# Patient Record
Sex: Male | Born: 1970
Health system: Southern US, Community
[De-identification: ages and names within clinical notes are randomized; demographics above are authoritative.]

## PROBLEM LIST (undated history)

## (undated) DIAGNOSIS — N433 Hydrocele, unspecified: Secondary | ICD-10-CM

## (undated) HISTORY — PX: WISDOM TOOTH EXTRACTION: SHX21

---

## 2017-06-24 ENCOUNTER — Ambulatory Visit
Admission: EM | Admit: 2017-06-24 | Discharge: 2017-06-24 | Disposition: A | Discharge status: Home / Self Care | Payer: BLUE CROSS/BLUE SHIELD

## 2017-06-24 DIAGNOSIS — N433 Hydrocele, unspecified: Secondary | ICD-10-CM

## 2017-06-24 HISTORY — DX: Hydrocele, unspecified: N43.3

## 2017-06-24 NOTE — ED Triage Notes (Signed)
Pt reports he has a hydrocele which has been "up and down for past several years." Has been bothering him x past year and doesn't have a PCP. Wants to be sent to a urologist. Denies pain

## 2017-06-24 NOTE — ED Provider Notes (Signed)
[facility]    CSN: [id number] Arrival date & time: 06/24/17  0859     History   Chief Complaint Chief Complaint  Patient presents with  . Edema    HPI Casey Bishop is a 46 y.o. male.   46 yo male with a h/o a right hydrocele diagnosed by ultrasound about 2 years ago, now with progressively enlargement over the last month. Denies any pain, redness, fevers, chills, urinary symptoms.  States he would like to see a urologist.    The history is provided by the patient.    Past Medical History:  Diagnosis Date  . Hydrocele     There are no active problems to display for this patient.   History reviewed. No pertinent surgical history.     Home Medications    Prior to Admission medications   Not on File    Family History History reviewed. No pertinent family history.  Social History Social History  Substance Use Topics  . Smoking status: Never Smoker  . Smokeless tobacco: Never Used  . Alcohol use No     Allergies   Patient has no known allergies.   Review of Systems Review of Systems   Physical Exam Triage Vital Signs ED Triage Vitals  Enc Vitals Group     BP 06/24/17 0913 126/77     Pulse Rate 06/24/17 0913 (!) 57     Resp 06/24/17 0913 18     Temp 06/24/17 0913 98.3 F (36.8 C)     Temp Source 06/24/17 0913 Oral     SpO2 06/24/17 0913 100 %     Weight 06/24/17 0911 202 lb (91.6 kg)     Height 06/24/17 0911 5\' 11"  (1.803 m)     Head Circumference --      Peak Flow --      Pain Score --      Pain Loc --      Pain Edu? --      Excl. in GC? --    No data found.   Updated Vital Signs BP 126/77 (BP Location: Left Arm)   Pulse (!) 57   Temp 98.3 F (36.8 C) (Oral)   Resp 18   Ht 5\' 11"  (1.803 m)   Wt 202 lb (91.6 kg)   SpO2 100%   BMI 28.17 kg/m   Visual Acuity Right Eye Distance:   Left Eye Distance:   Bilateral Distance:    Right Eye Near:   Left Eye Near:    Bilateral Near:     Physical Exam    Constitutional: He appears well-developed and well-nourished. No distress.  Genitourinary: Penis normal.  Genitourinary Comments: Large right soft tissue, non-tender scrotal mass consistent with likely hydrocele  Skin: He is not diaphoretic.  Nursing note and vitals reviewed.    UC Treatments / Results  Labs (all labs ordered are listed, but only abnormal results are displayed) Labs Reviewed - No data to display  EKG  EKG Interpretation None       Radiology No results found.  Procedures Procedures (including critical care time)  Medications Ordered in UC Medications - No data to display   Initial Impression / Assessment and Plan / UC Course  I have reviewed the triage vital signs and the nursing notes.  Pertinent labs & imaging results that were available during my care of the patient were reviewed by me and considered in my medical decision making (see chart for details).  Final Clinical Impressions(s) / UC Diagnoses   Final diagnoses:  Hydrocele, right    New Prescriptions There are no discharge medications for this patient.  1. diagnosis reviewed with patient 2. Recommend patient follow up with urologist for further evaluation and management; information of local urology group given to patient to call and set up follow up 3. Discussed with patient if symptoms worsen in the meantime, to go to ED  4. Establish care with PCP 5. F/u prn   Payton Mccallumonty, Lyndsi Altic, MD 06/24/17 1126

## 2017-07-04 ENCOUNTER — Encounter: Payer: Self-pay | Admitting: Urology

## 2017-07-04 ENCOUNTER — Ambulatory Visit (INDEPENDENT_AMBULATORY_CARE_PROVIDER_SITE_OTHER): Payer: BLUE CROSS/BLUE SHIELD | Admitting: Urology

## 2017-07-04 VITALS — BP 124/72 | HR 64 | Ht 71.0 in | Wt 204.5 lb

## 2017-07-04 DIAGNOSIS — N433 Hydrocele, unspecified: Secondary | ICD-10-CM | POA: Diagnosis not present

## 2017-07-04 LAB — URINALYSIS, COMPLETE
Bilirubin, UA: NEGATIVE
Glucose, UA: NEGATIVE
KETONES UA: NEGATIVE
LEUKOCYTES UA: NEGATIVE
Nitrite, UA: NEGATIVE
Protein, UA: NEGATIVE
SPEC GRAV UA: 1.025 (ref 1.005–1.030)
Urobilinogen, Ur: 0.2 mg/dL (ref 0.2–1.0)
pH, UA: 5.5 (ref 5.0–7.5)

## 2017-07-04 LAB — MICROSCOPIC EXAMINATION
BACTERIA UA: NONE SEEN
EPITHELIAL CELLS (NON RENAL): NONE SEEN /HPF (ref 0–10)
RBC, UA: NONE SEEN /hpf (ref 0–?)

## 2017-07-04 NOTE — Progress Notes (Signed)
07/04/2017 1:38 PM   Casey Bishop 05/16/1971 [id number]  Referring provider: No referring provider defined for this encounter.  Chief Complaint  Patient presents with  . Hydrocele    HPI: Pt presents with c/o right scrotal swelling. He reports he was diagnosed with an hydrocele about 10 years ago and it seems to be getting  Bigger. Noticeable growth over past 2 years. Bothersome from the perspective that it's very heavy and it "get's in the way" when he sits. Not bothersome. No limitation of activity. He has no LUTS. He's watched the video of the repair and cyst aspiration. He reports he had an US in [place] [facility] 1-2 years ago.  Modifying factors: There are no other modifying factors  Associated signs and symptoms: There are no other associated signs and symptoms Aggravating and relieving factors: There are no other aggravating or relieving factors Severity: Moderate Duration: Persistent      PMH: Past Medical History:  Diagnosis Date  . Hydrocele     Surgical History: Past Surgical History:  Procedure Laterality Date  . WISDOM TOOTH EXTRACTION      Home Medications:  Allergies as of 07/04/2017   No Known Allergies     Medication List    as of 07/04/2017  1:38 PM   You have not been prescribed any medications.     Allergies: No Known Allergies  Family History: Family History  Problem Relation Age of Onset  . Prostate cancer Neg Hx   . Bladder Cancer Neg Hx   . Kidney cancer Neg Hx     Social History:  reports that he has never smoked. He has never used smokeless tobacco. He reports that he does not drink alcohol or use drugs.  ROS: UROLOGY Frequent Urination?: No Hard to postpone urination?: No Burning/pain with urination?: No Get up at night to urinate?: No Leakage of urine?: No Urine stream starts and stops?: No Trouble starting stream?: No Do you have to strain to urinate?: No Blood in urine?: No Urinary tract infection?:  No Sexually transmitted disease?: No Injury to kidneys or bladder?: No Painful intercourse?: No Weak stream?: No Erection problems?: No Penile pain?: No  Gastrointestinal Nausea?: No Vomiting?: No Indigestion/heartburn?: No Diarrhea?: No Constipation?: No  Constitutional Fever: No Night sweats?: No Weight loss?: No Fatigue?: No  Skin Skin rash/lesions?: No Itching?: No  Eyes Blurred vision?: No Double vision?: No  Ears/Nose/Throat Sore throat?: No Sinus problems?: No  Hematologic/Lymphatic Swollen glands?: No Easy bruising?: No  Cardiovascular Leg swelling?: No Chest pain?: No  Respiratory Cough?: No Shortness of breath?: No  Endocrine Excessive thirst?: No  Musculoskeletal Back pain?: No Joint pain?: No  Neurological Headaches?: No Dizziness?: No  Psychologic Depression?: No Anxiety?: No  Physical Exam: BP 124/72 (BP Location: Left Arm, Patient Position: Sitting, Cuff Size: Normal)   Pulse 64   Ht 5\' 11"  (1.803 m)   Wt 92.8 kg (204 lb 8 oz)   BMI 28.52 kg/m   Constitutional:  Alert and oriented, No acute distress. HEENT: [place] AT, moist mucus membranes.  Trachea midline, no masses. Cardiovascular: No clubbing, cyanosis, or edema. Respiratory: Normal respiratory effort, no increased work of breathing. GI: Abdomen is soft, nontender, nondistended, no abdominal masses GU: No CVA tenderness.  Penis normal Scrotum normal; left testicle palpable normal and right testicle non-palp. Large cystic right hydrocele.  DRE - 30 g , smooth and no nodules  Skin: No rashes, bruises or suspicious lesions. Lymph: No cervical or inguinal adenopathy.  Neurologic: Grossly intact, no focal deficits, moving all 4 extremities. Psychiatric: Normal mood and affect.  Laboratory Data: No results found for: WBC, HGB, HCT, MCV, PLT  No results found for: CREATININE  No results found for: PSA  No results found for: TESTOSTERONE  No results found for:  HGBA1C  Urinalysis No results found for: COLORURINE, APPEARANCEUR, LABSPEC, PHURINE, GLUCOSEU, HGBUR, BILIRUBINUR, KETONESUR, PROTEINUR, UROBILINOGEN, NITRITE, LEUKOCYTESUR    Assessment & Plan:    1. Hydrocele, unspecified hydrocele type -- discussed with patient nature risk, benefit of surveillance, aspiration, and right hydrocelectomy. He is bothered by the heavy size. Will get [place] from his urologist in [place] or order a new one if no recent. I discussed one of my colleagues would be doing the procedure. We discussed risks such as swelling, hematoma, recurrence, bleeding and pain among others.    - Urinalysis, Complete   No Follow-up on file.  Jerilee Field, MD  [facility] [address] [place], [place] [address] [phone]

## 2017-07-06 ENCOUNTER — Other Ambulatory Visit: Payer: Self-pay | Admitting: Radiology

## 2017-07-06 ENCOUNTER — Telehealth: Payer: Self-pay | Admitting: Radiology

## 2017-07-06 ENCOUNTER — Other Ambulatory Visit: Payer: Self-pay

## 2017-07-06 DIAGNOSIS — N433 Hydrocele, unspecified: Secondary | ICD-10-CM

## 2017-07-06 NOTE — Telephone Encounter (Signed)
Pt scheduled for right hydrocelectomy with Dr Sherryl BartersBudzyn on 07/21/17 per pt request. Made pt aware of pre-admit testing phone interview on 07/11/17 between 1-5pm & to call day prior to surgery for arrival time to [facility]. Questions answered. Pt voices understanding.

## 2017-07-11 ENCOUNTER — Encounter: Admission: RE | Admit: 2017-07-11 | Payer: BLUE CROSS/BLUE SHIELD | Source: Ambulatory Visit

## 2017-07-12 ENCOUNTER — Other Ambulatory Visit: Payer: Self-pay | Admitting: Radiology

## 2017-07-12 ENCOUNTER — Telehealth: Payer: Self-pay | Admitting: Radiology

## 2017-07-12 DIAGNOSIS — N433 Hydrocele, unspecified: Secondary | ICD-10-CM

## 2017-07-12 NOTE — Telephone Encounter (Signed)
Pt states he would like to pursue aspiration of his right hydrocele instead of hydrocelectomy d/t cost. Advised pt that per Dr Mena Goes pt will need a current scrotal US prior to procedure in the office & hospital will contact pt to schedule the [place]. Appt made for aspiration in the office with Dr Mena Goes. Questions answered. Pt voices understanding.

## 2017-07-14 ENCOUNTER — Other Ambulatory Visit: Payer: BLUE CROSS/BLUE SHIELD

## 2017-07-18 ENCOUNTER — Ambulatory Visit
Admission: RE | Admit: 2017-07-18 | Discharge: 2017-07-18 | Disposition: A | Discharge status: Home / Self Care | Payer: BLUE CROSS/BLUE SHIELD | Source: Ambulatory Visit | Attending: Urology | Admitting: Urology

## 2017-07-18 ENCOUNTER — Ambulatory Visit: Payer: BLUE CROSS/BLUE SHIELD

## 2017-07-18 DIAGNOSIS — N433 Hydrocele, unspecified: Secondary | ICD-10-CM | POA: Insufficient documentation

## 2017-07-18 DIAGNOSIS — N442 Benign cyst of testis: Secondary | ICD-10-CM | POA: Diagnosis not present

## 2017-07-21 ENCOUNTER — Ambulatory Visit: Admission: RE | Admit: 2017-07-21 | Payer: BLUE CROSS/BLUE SHIELD | Source: Ambulatory Visit | Admitting: Urology

## 2017-07-21 ENCOUNTER — Encounter: Admission: RE | Payer: Self-pay | Source: Ambulatory Visit

## 2017-07-21 SURGERY — HYDROCELECTOMY
Anesthesia: Choice | Laterality: Right

## 2017-07-25 ENCOUNTER — Telehealth: Payer: Self-pay

## 2017-07-25 NOTE — Telephone Encounter (Signed)
Jerilee FieldEskridge, Matthew, MD  Skeet LatchWatkins, Roslin Norwood C, LPN        Notify patient his scrotal US showed no testicle mass and a large right hydrocele. F/u as planned.   Spoke with pt in reference to US results. Pt voiced understanding.

## 2017-08-01 ENCOUNTER — Encounter: Payer: Self-pay | Admitting: Urology

## 2017-08-01 ENCOUNTER — Ambulatory Visit (INDEPENDENT_AMBULATORY_CARE_PROVIDER_SITE_OTHER): Payer: BLUE CROSS/BLUE SHIELD | Admitting: Urology

## 2017-08-01 VITALS — BP 114/69 | HR 62 | Ht 71.0 in | Wt 197.7 lb

## 2017-08-01 DIAGNOSIS — N433 Hydrocele, unspecified: Secondary | ICD-10-CM

## 2017-08-01 NOTE — Progress Notes (Signed)
   08/01/17  CC:  Chief Complaint  Patient presents with  . Hydrocele    HPI: Patient returns for right hydrocele aspiration. He underwent a scrotal ultrasound and I reviewed the images. This showed normal testicles. There was a large right hydrocele. There was at least a 5 cm skin to testicle distance at the closest point. I discussed with the patient again the nature risks benefits and alternatives to aspiration. We discussed risk of aspiration include bleeding, infection and rapid recurrence among others. All questions answered. He elects to proceed.   Blood pressure 114/69, pulse 62, height 5\' 11"  (1.803 m), weight 89.7 kg (197 lb 11.2 oz). NED. A&Ox3.   No respiratory distress   Abd soft, NT, ND Normal phallus with bilateral descended testicles, large right hydrocele (5 cm x 10 cm)  Hydrocele aspiration Procedure Note  Patient identification was confirmed, informed consent was obtained, and patient was prepped using Betadine solution over the right hemiscrotum. Using ultrasound guidance a 20-gauge needle was advanced into the hydrocele fluid. 425 cc of straw-colored fluid was aspirated. The needle was withdrawn and pressure held for a few minutes. He tolerated the procedure well. There were no complications. The left testicle was palpable at the beginning of the procedure and was palpably normal. The right testicle was palpable at the end of the procedure and was palpably normal.  Assessment/ Plan: Right hydrocele aspiration-he can follow-up as needed.

## 2017-10-03 ENCOUNTER — Encounter: Payer: Self-pay | Admitting: Internal Medicine

## 2017-10-03 ENCOUNTER — Ambulatory Visit (INDEPENDENT_AMBULATORY_CARE_PROVIDER_SITE_OTHER): Payer: BLUE CROSS/BLUE SHIELD | Admitting: Internal Medicine

## 2017-10-03 VITALS — BP 122/74 | HR 76 | Ht 71.0 in | Wt 194.0 lb

## 2017-10-03 DIAGNOSIS — Z87438 Personal history of other diseases of male genital organs: Secondary | ICD-10-CM

## 2017-10-03 DIAGNOSIS — Z7689 Persons encountering health services in other specified circumstances: Secondary | ICD-10-CM | POA: Diagnosis not present

## 2017-10-03 NOTE — Progress Notes (Signed)
    Date:  10/03/2017   Name:  Casey Bishop   DOB:  07/16/1971   MRN:  [id number]   Chief Complaint: Establish Care (From [place]. Been here for about a year now- just needs PCP. ) He denies any chronic health issues.  Recently had hydrocele drained and doing fine.  He did have a prostate exam at that time as was normal. He has family hx of intestinal cancer in father (probably pancreatic) but no other family hx cancer. He needs to establish PCP. May want to get a CPX next year.   Review of Systems  Constitutional: Negative for chills, fatigue and fever.  Eyes: Negative for visual disturbance.  Respiratory: Negative for cough, chest tightness, shortness of breath and wheezing.   Cardiovascular: Negative for chest pain, palpitations and leg swelling.  Gastrointestinal: Negative for abdominal pain.  Musculoskeletal: Negative for arthralgias and gait problem.  Skin: Negative for rash.  Allergic/Immunologic: Negative for environmental allergies.  Neurological: Negative for dizziness and headaches.  Hematological: Negative for adenopathy.  Psychiatric/Behavioral: Negative for dysphoric mood and sleep disturbance.    There are no active problems to display for this patient.   Prior to Admission medications   Medication Sig Start Date End Date Taking? Authorizing Provider  Multiple Vitamin (MULTIVITAMIN WITH MINERALS) TABS tablet Take 1 tablet by mouth 2 (two) times a week. One-A-Day   Yes [provider]    No Known Allergies  Past Surgical History:  Procedure Laterality Date  . WISDOM TOOTH EXTRACTION      Social History   Tobacco Use  . Smoking status: Never Smoker  . Smokeless tobacco: Never Used  Substance Use Topics  . Alcohol use: No  . Drug use: No     Medication list has been reviewed and updated.  PHQ 2/9 Scores 10/03/2017  PHQ - 2 Score 0    Physical Exam  Constitutional: He is oriented to person, place, and time. He appears well-developed. No  distress.  HENT:  Head: Normocephalic and atraumatic.  Neck: Normal range of motion. Neck supple. No thyromegaly present.  Cardiovascular: Normal rate, regular rhythm and normal heart sounds.  Pulmonary/Chest: Effort normal and breath sounds normal. No respiratory distress. He has no wheezes.  Abdominal: Soft. Bowel sounds are normal. He exhibits no distension. There is no tenderness.  Musculoskeletal: Normal range of motion. He exhibits no edema.  Neurological: He is alert and oriented to person, place, and time.  Skin: Skin is warm and dry. No rash noted.  Psychiatric: He has a normal mood and affect. His behavior is normal. Thought content normal.  Nursing note and vitals reviewed.   BP 122/74   Pulse 76   Ht 5\' 11"  (1.803 m)   Wt 194 lb (88 kg)   SpO2 98%   BMI 27.06 kg/m   Assessment and Plan: 1. Encounter to establish care with new doctor Return for CPX next year and as needed  2. History of hydrocele resolved   No orders of the defined types were placed in this encounter.   Partially dictated using Animal nutritionistDragon software. Any errors are unintentional.  Bari EdwardLaura Nial Hawe, MD [facility]  10/03/2017

## 2017-12-05 ENCOUNTER — Encounter: Payer: Self-pay | Admitting: Internal Medicine

## 2017-12-05 ENCOUNTER — Ambulatory Visit (INDEPENDENT_AMBULATORY_CARE_PROVIDER_SITE_OTHER): Payer: BLUE CROSS/BLUE SHIELD | Admitting: Internal Medicine

## 2017-12-05 VITALS — BP 120/76 | HR 66 | Ht 71.0 in | Wt 183.0 lb

## 2017-12-05 DIAGNOSIS — Z125 Encounter for screening for malignant neoplasm of prostate: Secondary | ICD-10-CM

## 2017-12-05 DIAGNOSIS — Z0000 Encounter for general adult medical examination without abnormal findings: Secondary | ICD-10-CM

## 2017-12-05 DIAGNOSIS — Z1211 Encounter for screening for malignant neoplasm of colon: Secondary | ICD-10-CM

## 2017-12-05 LAB — POCT URINALYSIS DIPSTICK
Bilirubin, UA: NEGATIVE
GLUCOSE UA: NEGATIVE
Ketones, UA: NEGATIVE
LEUKOCYTES UA: NEGATIVE
Nitrite, UA: NEGATIVE
PH UA: 6 (ref 5.0–8.0)
RBC UA: NEGATIVE
Spec Grav, UA: 1.02 (ref 1.010–1.025)
UROBILINOGEN UA: 0.2 U/dL

## 2017-12-05 NOTE — Progress Notes (Signed)
Date:  12/05/2017   Name:  Casey Bishop   DOB:  06/30/1971   MRN:  [id number]   Chief Complaint: Annual Exam Casey Bishop is a 47 y.o. male who presents today for his Complete Annual Exam. He feels well. He reports exercising regularly. He reports he is sleeping well.  He denies family hx of colon or prostate cancer.  His father died of intestinal cancer (not colon). He is exercising regularly - has lost about 10 lbs with exercise and diet over the past year. He needs labs for work, including cotinine.  He does not smoke and consumes no alcohol.   Review of Systems  Constitutional: Negative for appetite change, chills, diaphoresis, fatigue and unexpected weight change.  HENT: Negative for hearing loss, tinnitus, trouble swallowing and voice change.   Eyes: Negative for visual disturbance.  Respiratory: Negative for choking, shortness of breath and wheezing.   Cardiovascular: Negative for chest pain, palpitations and leg swelling.  Gastrointestinal: Negative for abdominal pain, blood in stool, constipation and diarrhea.  Genitourinary: Negative for difficulty urinating, dysuria and frequency.  Musculoskeletal: Negative for arthralgias, back pain and myalgias.  Skin: Negative for color change and rash.  Neurological: Negative for dizziness, syncope and headaches.  Hematological: Negative for adenopathy.  Psychiatric/Behavioral: Negative for dysphoric mood and sleep disturbance.    Patient Active Problem List   Diagnosis Date Noted  . History of hydrocele 10/03/2017    Prior to Admission medications   Medication Sig Start Date End Date Taking? Authorizing Provider  Multiple Vitamin (MULTIVITAMIN WITH MINERALS) TABS tablet Take 1 tablet by mouth 2 (two) times a week. One-A-Day   Yes [provider]    No Known Allergies  Past Surgical History:  Procedure Laterality Date  . WISDOM TOOTH EXTRACTION      Social History   Tobacco Use  . Smoking status: Never Smoker   . Smokeless tobacco: Never Used  Substance Use Topics  . Alcohol use: No  . Drug use: No     Medication list has been reviewed and updated.  PHQ 2/9 Scores 10/03/2017  PHQ - 2 Score 0    Physical Exam  Constitutional: He is oriented to person, place, and time. He appears well-developed and well-nourished.  HENT:  Head: Normocephalic.  Right Ear: Tympanic membrane, external ear and ear canal normal.  Left Ear: Tympanic membrane, external ear and ear canal normal.  Nose: Nose normal.  Mouth/Throat: Uvula is midline and oropharynx is clear and moist.  Eyes: Conjunctivae and EOM are normal. Pupils are equal, round, and reactive to light.  Neck: Normal range of motion. Neck supple. Carotid bruit is not present. No thyromegaly present.  Cardiovascular: Normal rate, regular rhythm, normal heart sounds and intact distal pulses.  Pulmonary/Chest: Effort normal and breath sounds normal. He has no wheezes. Right breast exhibits no mass. Left breast exhibits no mass.  Abdominal: Soft. Normal appearance and bowel sounds are normal. There is no hepatosplenomegaly. There is no tenderness.  Musculoskeletal: Normal range of motion. He exhibits no edema or tenderness.  Lymphadenopathy:    He has no cervical adenopathy.  Neurological: He is alert and oriented to person, place, and time. He has normal reflexes.  Skin: Skin is warm, dry and intact.  Psychiatric: He has a normal mood and affect. His speech is normal and behavior is normal. Judgment and thought content normal.  Nursing note and vitals reviewed.   BP 120/76 (BP Location: Right Arm, Patient Position: Sitting, Cuff Size: Normal)  Pulse 66   Ht 5\' 11"  (1.803 m)   Wt 183 lb (83 kg)   SpO2 100%   BMI 25.52 kg/m   Assessment and Plan: 1. Annual physical exam Normal exam - CBC with Differential/Platelet - Comprehensive metabolic panel - Lipid panel - POCT urinalysis dipstick - Nicotine/cotinine metabolites  2. Colon cancer  screening Should start screening at age 70 - Ambulatory referral to Gastroenterology  3. Prostate cancer screening DRE deferred - done recently by Urology - PSA   No orders of the defined types were placed in this encounter.   Partially dictated using Animal nutritionist. Any errors are unintentional.  Bari Edward, MD [facility]  12/05/2017

## 2017-12-11 ENCOUNTER — Encounter: Payer: Self-pay | Admitting: Internal Medicine

## 2017-12-11 DIAGNOSIS — E785 Hyperlipidemia, unspecified: Secondary | ICD-10-CM | POA: Insufficient documentation

## 2017-12-11 LAB — CBC WITH DIFFERENTIAL/PLATELET
Basophils Absolute: 0.1 10*3/uL (ref 0.0–0.2)
Basos: 1 %
EOS (ABSOLUTE): 0.3 10*3/uL (ref 0.0–0.4)
EOS: 5 %
HEMATOCRIT: 41.9 % (ref 37.5–51.0)
HEMOGLOBIN: 13.4 g/dL (ref 13.0–17.7)
IMMATURE GRANULOCYTES: 0 %
Immature Grans (Abs): 0 10*3/uL (ref 0.0–0.1)
Lymphocytes Absolute: 1.9 10*3/uL (ref 0.7–3.1)
Lymphs: 31 %
MCH: 27.6 pg (ref 26.6–33.0)
MCHC: 32 g/dL (ref 31.5–35.7)
MCV: 86 fL (ref 79–97)
MONOCYTES: 10 %
Monocytes Absolute: 0.6 10*3/uL (ref 0.1–0.9)
NEUTROS PCT: 53 %
Neutrophils Absolute: 3.3 10*3/uL (ref 1.4–7.0)
Platelets: 253 10*3/uL (ref 150–379)
RBC: 4.86 x10E6/uL (ref 4.14–5.80)
RDW: 14 % (ref 12.3–15.4)
WBC: 6.2 10*3/uL (ref 3.4–10.8)

## 2017-12-11 LAB — COMPREHENSIVE METABOLIC PANEL
ALT: 11 IU/L (ref 0–44)
AST: 18 IU/L (ref 0–40)
Albumin/Globulin Ratio: 1.6 (ref 1.2–2.2)
Albumin: 4.6 g/dL (ref 3.5–5.5)
Alkaline Phosphatase: 66 IU/L (ref 39–117)
BUN/Creatinine Ratio: 10 (ref 9–20)
BUN: 10 mg/dL (ref 6–24)
Bilirubin Total: 0.6 mg/dL (ref 0.0–1.2)
CALCIUM: 9.1 mg/dL (ref 8.7–10.2)
CO2: 24 mmol/L (ref 20–29)
CREATININE: 0.99 mg/dL (ref 0.76–1.27)
Chloride: 102 mmol/L (ref 96–106)
GFR calc Af Amer: 106 mL/min/{1.73_m2} (ref >59)
GFR, EST NON AFRICAN AMERICAN: 92 mL/min/{1.73_m2} (ref >59)
GLOBULIN, TOTAL: 2.9 g/dL (ref 1.5–4.5)
Glucose: 78 mg/dL (ref 65–99)
Potassium: 4.1 mmol/L (ref 3.5–5.2)
SODIUM: 143 mmol/L (ref 134–144)
Total Protein: 7.5 g/dL (ref 6.0–8.5)

## 2017-12-11 LAB — LIPID PANEL
CHOL/HDL RATIO: 3.4 ratio (ref 0.0–5.0)
Cholesterol, Total: 162 mg/dL (ref 100–199)
HDL: 48 mg/dL (ref >39)
LDL CALC: 104 mg/dL — AB (ref 0–99)
TRIGLYCERIDES: 50 mg/dL (ref 0–149)
VLDL CHOLESTEROL CAL: 10 mg/dL (ref 5–40)

## 2017-12-11 LAB — NICOTINE/COTININE METABOLITES
COTININE: NOT DETECTED ng/mL
Nicotine: NOT DETECTED ng/mL

## 2017-12-11 LAB — PSA: Prostate Specific Ag, Serum: 0.8 ng/mL (ref 0.0–4.0)

## 2019-01-03 IMAGING — US US ART/VEN ABD/PELV/SCROTUM DOPPLER LTD
1 series · 13 of 25 positions shown · non-contrast
Comparison: None.

CLINICAL DATA: Large symptomatic Right hydrocele

EXAM:
SCROTAL ULTRASOUND
DOPPLER ULTRASOUND OF THE TESTICLES
TECHNIQUE: Complete ultrasound examination of the testicles, epididymis, and
other scrotal structures was performed. Color and spectral Doppler
ultrasound were also utilized to evaluate blood flow to the
testicles.

[Series 1: us art/ven abd/pelv/scrotum doppler ltd · 0.08mm/px · 62 acquisitions, 13 frames shown]
[im 1/62]
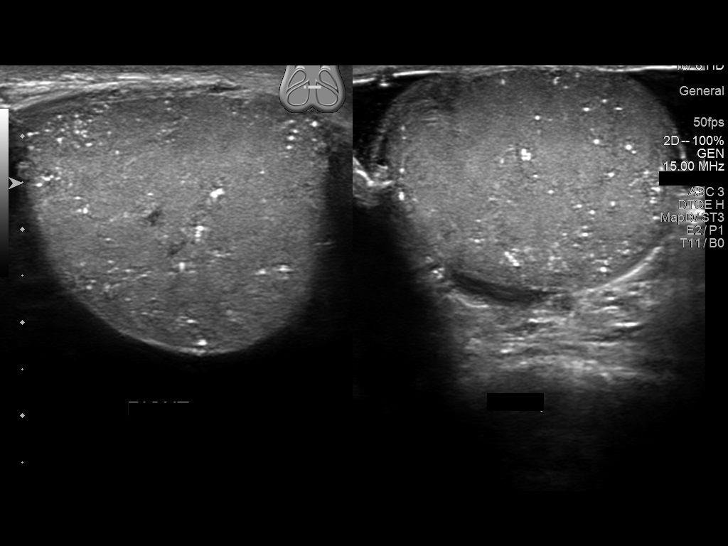
[im 6/62]
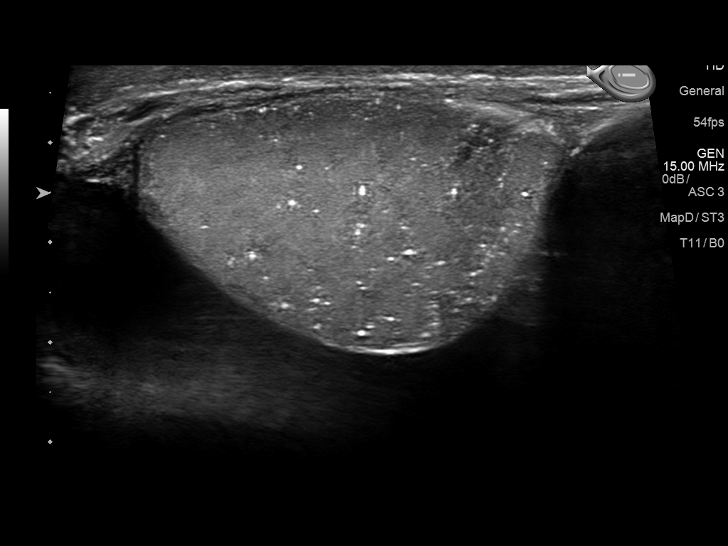
[im 11/62]
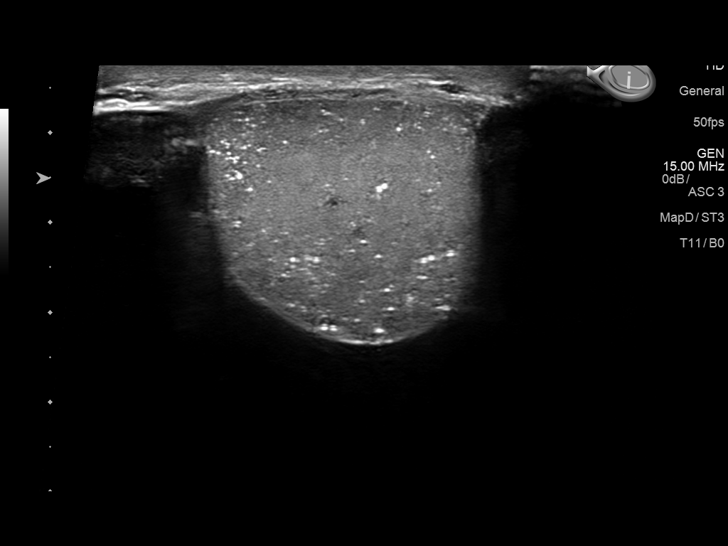
[im 16/62]
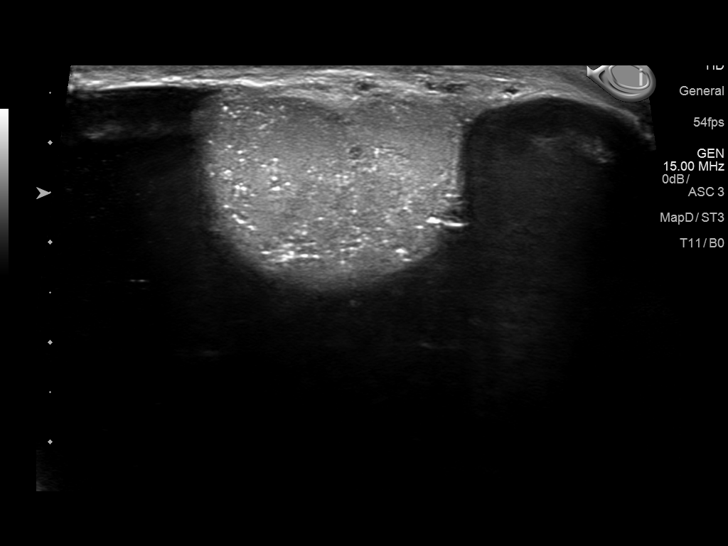
[im 21/62]
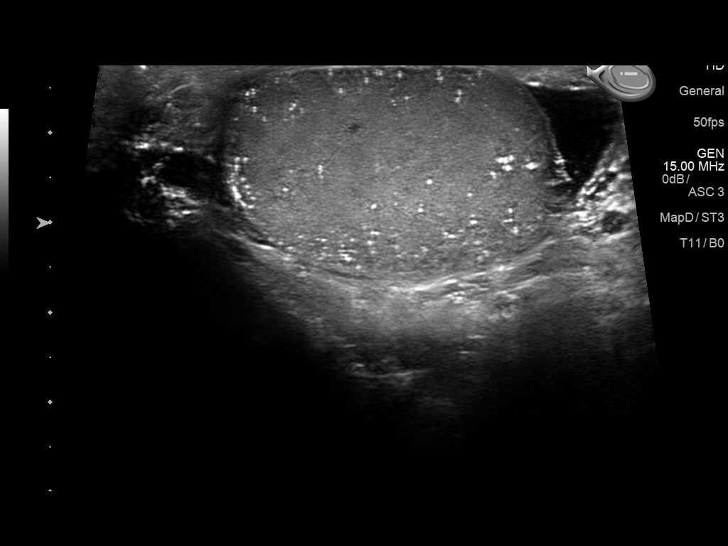
[im 26/62]
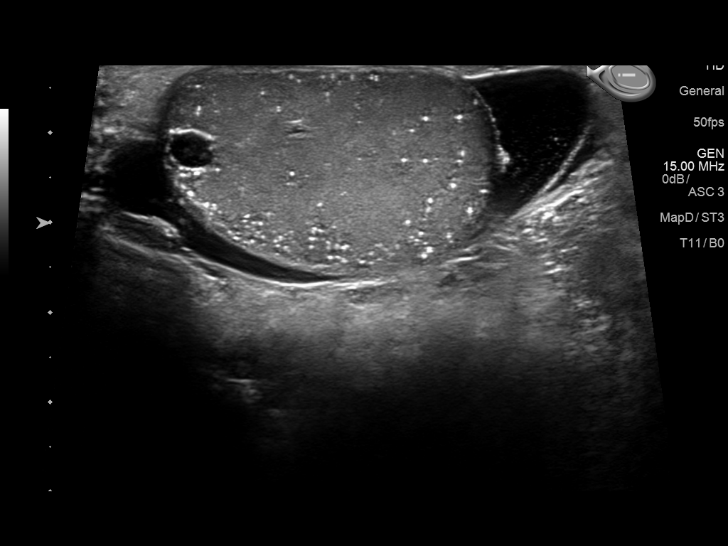
[im 31/62]
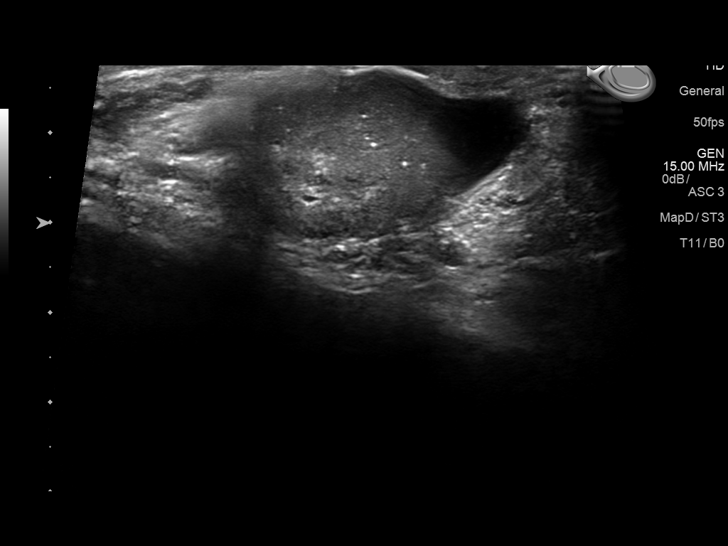
[im 36/62]
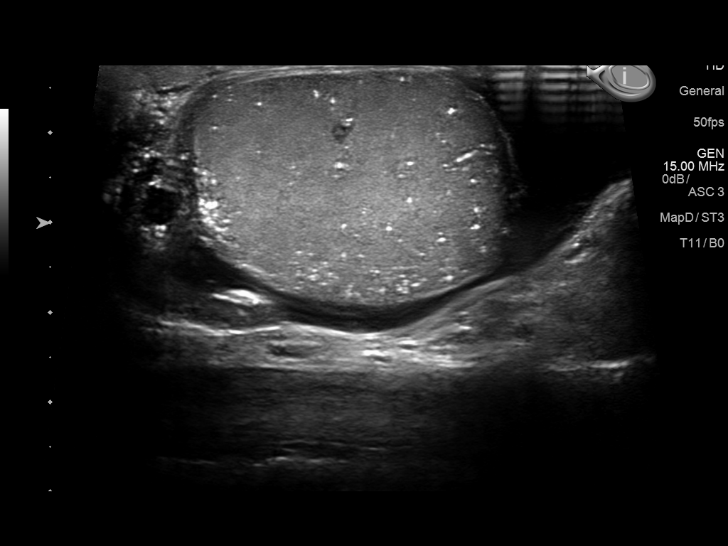
[im 41/62]
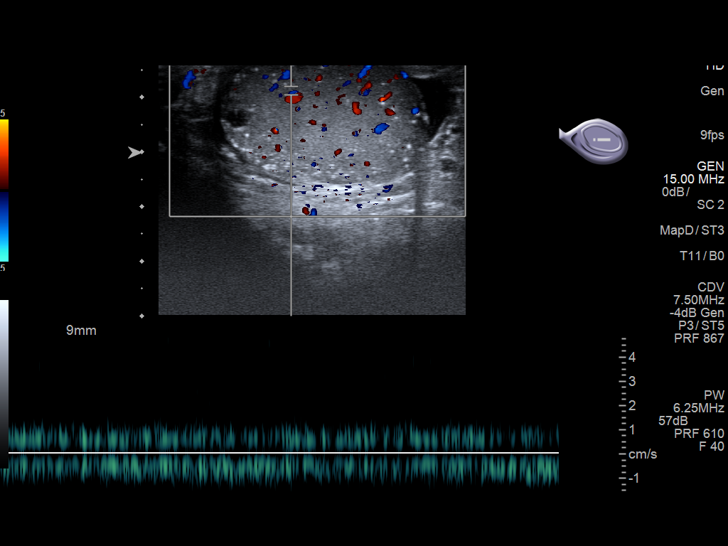
[im 46/62]
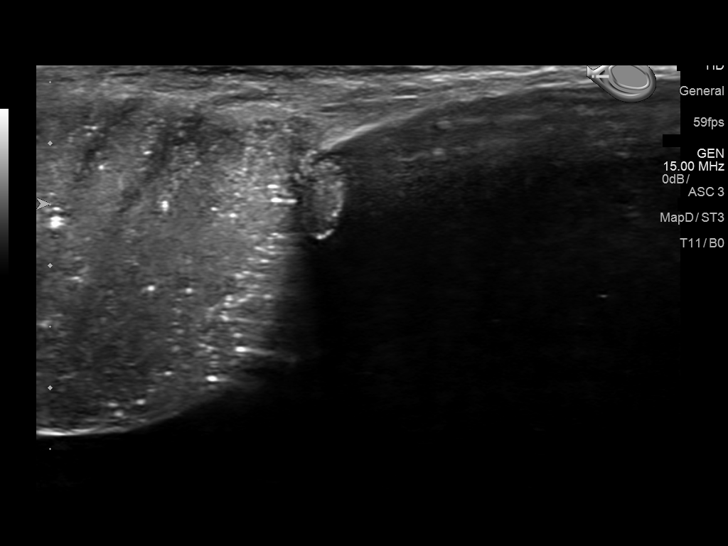
[im 51/62]
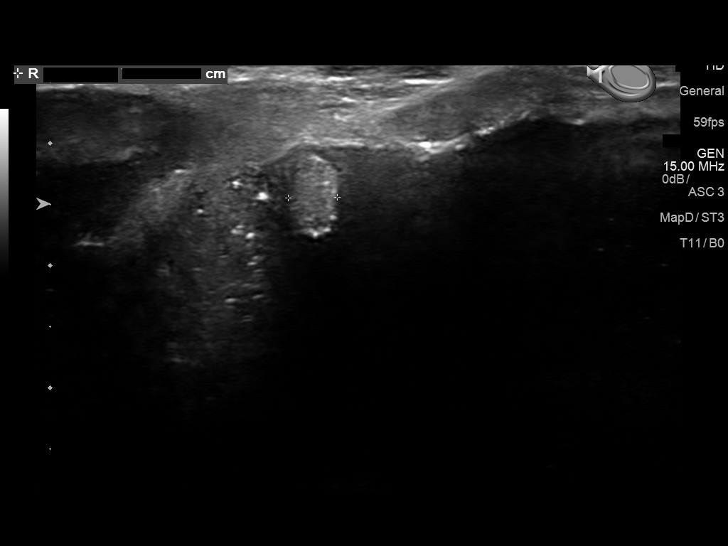
[im 56/62]
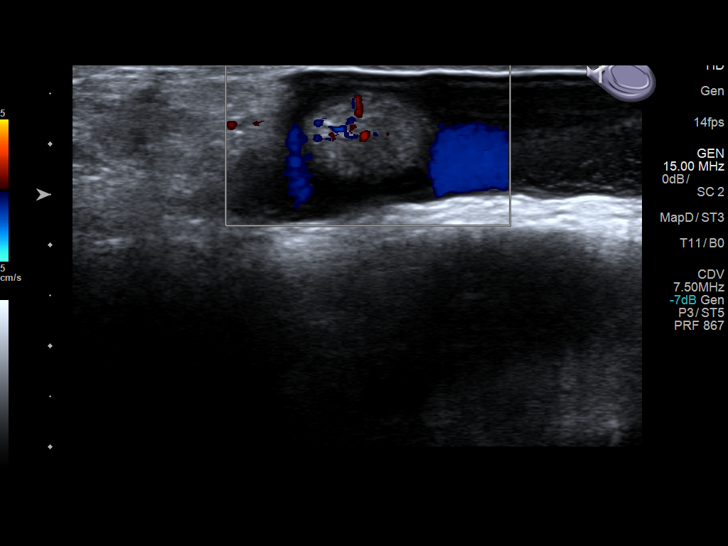
[im 62/62]
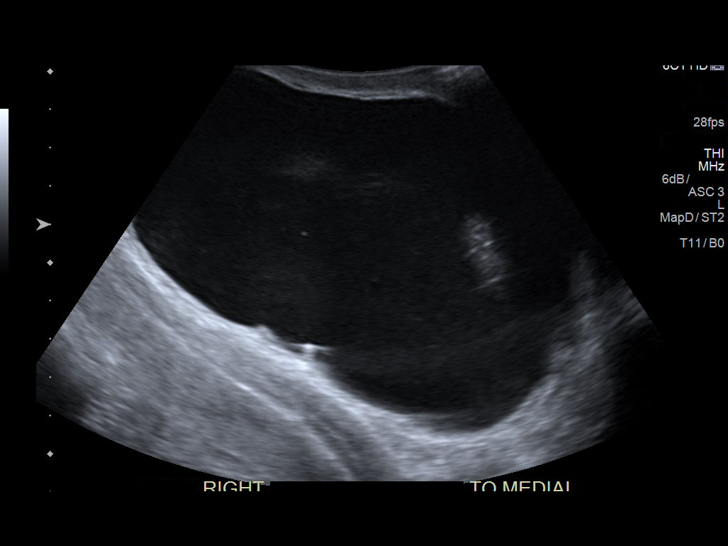

[13 of 25 positions shown; findings below may reference images not displayed]

FINDINGS: Right testicle

Measurements: 4.4 x 2.6 x 3.1 cm. Microlithiasis evident. No focal
testicular abnormality. Normal vascularity. Negative for torsion.

Left testicle

Measurements: 3.7 x 2.4 x 3.5 cm. Microlithiasis evident. Minimally
complex anechoic left testicular cyst with peripheral calcification
measures 6 x 5 x 5 mm. Normal vascularity. Negative for torsion.

Right epididymis: Grossly normal but appears compressed by the
hydrocele.

Left epididymis:  Normal in size and appearance.

Hydrocele: Very large right hydrocele. Small left hydrocele also
evident.

Varicocele:  None visualized.

Pulsed Doppler interrogation of both testes demonstrates normal low
resistance arterial and venous waveforms bilaterally.
IMPRESSION: Large right hydrocele.

Small left hydrocele

Bilateral testicular microlithiasis

Current literature suggests that testicular microlithiasis is not a
significant independent risk factor for development of testicular
carcinoma, and that follow up imaging is not warranted in the
absence of other risk factors. Monthly testicular self-examination
and annual physical exams are considered appropriate surveillance.
If patient has other risk factors for testicular carcinoma, then
referral to Urology should be considered. (Reference: Ronlor, et
al.: A 5-Year Follow up Study of Asymptomatic Men with Testicular
Microlithiasis. J Urol 1668; [id number].)

Negative for torsion.  Normal scrotal Doppler.

6 mm left testicular cyst superiorly.

## 2019-07-03 ENCOUNTER — Encounter: Payer: Self-pay | Admitting: Urology

## 2019-07-03 ENCOUNTER — Other Ambulatory Visit: Payer: Self-pay | Admitting: Radiology

## 2019-07-03 ENCOUNTER — Telehealth: Payer: Self-pay | Admitting: Radiology

## 2019-07-03 ENCOUNTER — Ambulatory Visit (INDEPENDENT_AMBULATORY_CARE_PROVIDER_SITE_OTHER): Payer: 59 | Admitting: Urology

## 2019-07-03 ENCOUNTER — Other Ambulatory Visit: Payer: Self-pay

## 2019-07-03 VITALS — BP 137/81 | HR 60 | Ht 71.0 in | Wt 187.0 lb

## 2019-07-03 DIAGNOSIS — N433 Hydrocele, unspecified: Secondary | ICD-10-CM

## 2019-07-03 NOTE — Patient Instructions (Signed)
Hydrocele, Adult °A hydrocele is a collection of fluid in the loose pouch of skin that holds the testicles (scrotum). This may happen because: °· The amount of fluid produced in the scrotum is not absorbed by the rest of the body. °· Fluid from the abdomen fills the scrotum. Normally, the testicles develop in the abdomen then move (drop) into to the scrotum before birth. The tube that the testicles travel through usually closes after the testicles drop. If the tube does not close, fluid from the abdomen can fill the scrotum. This is less common in adults. °What are the causes? °The cause of a hydrocele in adults is usually not known. However, it may be caused by: °· An injury to the scrotum. °· An infection (epididymitis). °· Decreased blood flow to the scrotum. °· Twisting of a testicle (testicular torsion). °· A birth defect. °· A tumor or cancer of the testicle. °What are the signs or symptoms? °A hydrocele feels like a water-filled balloon. It may also feel heavy. Other symptoms include: °· Swelling of the scrotum. The swelling may decrease when you lie down. You may also notice more swelling at night than in the morning. °· Swelling of the groin. °· Mild discomfort in the scrotum. °· Pain. This can develop if the hydrocele was caused by infection or twisting. The larger the hydrocele, the more likely you are to have pain. °How is this diagnosed? °This condition may be diagnosed based on: °· Physical exam. °· Medical history. °You may also have other tests, including: °· Imaging tests, such as ultrasound. °· Blood or urine tests. °How is this treated? °Most hydroceles go away on their own. If you have no discomfort or pain, your health care provider may suggest close monitoring of your condition (called watch and wait or watchful waiting) until the condition goes away or symptoms develop. If treatment is needed, it may include: °· Treating an underlying condition. This may include using an antibiotic medicine to  treat an infection. °· Surgery to stop fluid from collecting in the scrotum. °· Surgery to drain the fluid. Options include: °? Needle aspiration. A needle is used to drain fluid. However, the fluid buildup will come back quickly. °? Hydrocelectomy. For this procedure, an incision is made in the scrotum to remove the fluid sac. °Follow these instructions at home: °· Watch the hydrocele for any changes. °· Take over-the-counter and prescription medicines only as told by your health care provider. °· If you were prescribed an antibiotic medicine, use it as told by your health care provider. Do not stop taking the antibiotic even if you start to feel better. °· Keep all follow-up visits as told by your health care provider. This is important. °Contact a health care provider if: °· You notice any changes in the hydrocele. °· The swelling in your scrotum or groin gets worse. °· The hydrocele becomes red, firm, painful, or tender to the touch. °· You have a fever. °Get help right away if you: °· Develop a lot of pain, or your pain becomes worse. °Summary °· A hydrocele is a collection of fluid in the loose pouch of skin that holds the testicles (scrotum). °· Hydroceles can cause swelling, discomfort, and sometimes pain. °· In adults, the cause of a hydrocele usually is not known. However, it is sometimes caused by an infection or a rotation and twisting of the scrotum. °· Treatment is usually not needed. Hydroceles often go away on their own. If a hydrocele causes pain, treatment   may be given to ease the pain. °This information is not intended to replace advice given to you by your health care provider. Make sure you discuss any questions you have with your health care provider. °Document Released: 04/27/2010 Document Revised: 11/18/2017 Document Reviewed: 11/18/2017 °Elsevier Patient Education © 2020 [employer] ° °

## 2019-07-03 NOTE — Progress Notes (Signed)
07/03/2019 9:07 AM   Casey Bishop 1971/07/14 [id number]  Referring provider: Glean Hess, MD [address] [place],  [place] [address]  Chief Complaint  Patient presents with  . Hydrocele    HPI:  Casey Bishop returns with a right hydrocele.  Hydrocele noted in about 2008 on the left side.  Ultrasound July 18, 2017 confirmed a left hydrocele.  He ultimately underwent aspiration in the office on 08/01/2017 yielding about 450 cc of straw-colored fluid.  He returns and has recurrence of bothersome right hydrocele. It remained small for only about "a month" in 2018 and he is no ready to entertain surgical repair. The hydrocele seems larger and more bothersome. It gets in the way of daily activities. No voiding complaints. No gross hematuria or dysuria.   PMH: Past Medical History:  Diagnosis Date  . Hydrocele     Surgical History: Past Surgical History:  Procedure Laterality Date  . WISDOM TOOTH EXTRACTION      Home Medications:  Allergies as of 07/03/2019   No Known Allergies     Medication List       Accurate as of July 03, 2019  9:07 AM. If you have any questions, ask your nurse or doctor.        multivitamin with minerals Tabs tablet Take 1 tablet by mouth 2 (two) times a week. One-A-Day       Allergies: No Known Allergies  Family History: Family History  Problem Relation Age of Onset  . Diabetes Mother   . Stomach cancer Father        intestinal   . Heart failure Paternal Grandmother     Social History:  reports that he has never smoked. He has never used smokeless tobacco. He reports that he does not drink alcohol or use drugs.  ROS: UROLOGY Frequent Urination?: No Hard to postpone urination?: No Burning/pain with urination?: No Get up at night to urinate?: No Leakage of urine?: No Urine stream starts and stops?: No Trouble starting stream?: No Do you have to strain to urinate?: No Blood in urine?: No Urinary tract infection?: No  Sexually transmitted disease?: No Injury to kidneys or bladder?: No Painful intercourse?: No Weak stream?: No Erection problems?: No Penile pain?: No  Gastrointestinal Nausea?: No Vomiting?: No Indigestion/heartburn?: No Diarrhea?: No Constipation?: No  Constitutional Fever: No Night sweats?: No Weight loss?: No Fatigue?: No  Skin Skin rash/lesions?: No Itching?: No  Eyes Blurred vision?: No Double vision?: No  Ears/Nose/Throat Sore throat?: No Sinus problems?: No  Hematologic/Lymphatic Swollen glands?: No Easy bruising?: No  Cardiovascular Leg swelling?: No Chest pain?: No  Respiratory Cough?: No Shortness of breath?: No  Endocrine Excessive thirst?: No  Musculoskeletal Back pain?: No Joint pain?: No  Neurological Headaches?: No Dizziness?: No  Psychologic Depression?: No Anxiety?: No  Physical Exam: BP 137/81 (BP Location: Left Arm, Patient Position: Sitting, Cuff Size: Normal)   Pulse 60   Ht 5\' 11"  (1.803 m)   Wt 84.8 kg   BMI 26.08 kg/m   Constitutional:  Alert and oriented, No acute distress. HEENT: [place] AT, moist mucus membranes.  Trachea midline, no masses. Cardiovascular: No clubbing, cyanosis, or edema. Respiratory: Normal respiratory effort, no increased work of breathing. GI: Abdomen is soft, nontender, nondistended, no abdominal masses GU: No CVA tenderness. Penis circumcised, normal foreskin, testicles descended bilaterally. Left palpably normal, left epididymis palpably normal, scrotum normal; large tense right hydrocele ~ 10 cm  DRE: Prostate 30 g, smooth without hard area  or nodule Lymph: No cervical or inguinal lymphadenopathy. Skin: No rashes, bruises or suspicious lesions. Neurologic: Grossly intact, no focal deficits, moving all 4 extremities. Psychiatric: Normal mood and affect.  Laboratory Data: Lab Results  Component Value Date   WBC 6.2 12/05/2017   HGB 13.4 12/05/2017   HCT 41.9 12/05/2017   MCV 86 12/05/2017    PLT 253 12/05/2017    Lab Results  Component Value Date   CREATININE 0.99 12/05/2017    No results found for: PSA  No results found for: TESTOSTERONE  No results found for: HGBA1C  Urinalysis    Component Value Date/Time   APPEARANCEUR Clear 07/04/2017 1307   GLUCOSEU Negative 07/04/2017 1307   BILIRUBINUR neg 12/05/2017 0848   BILIRUBINUR Negative 07/04/2017 1307   PROTEINUR trace 12/05/2017 0848   PROTEINUR Negative 07/04/2017 1307   UROBILINOGEN 0.2 12/05/2017 0848   NITRITE neg 12/05/2017 0848   NITRITE Negative 07/04/2017 1307   LEUKOCYTESUR Negative 12/05/2017 0848   LEUKOCYTESUR Negative 07/04/2017 1307    Lab Results  Component Value Date   LABMICR See below: 07/04/2017   WBCUA 0-5 07/04/2017   RBCUA None seen 07/04/2017   LABEPIT None seen 07/04/2017   BACTERIA None seen 07/04/2017    Pertinent Imaging: US 2018 reviewed images  No results found for this or any previous visit. No results found for this or any previous visit. No results found for this or any previous visit. No results found for this or any previous visit. No results found for this or any previous visit. No results found for this or any previous visit. No results found for this or any previous visit. No results found for this or any previous visit.  Assessment & Plan:    Right hydrocele - we discussed the nature r/b/a to right hydrocelectomy and he elects to proceed. Discussed one of my colleagues will be doing the procedure. I would recommend a repeat US to assess the right testicle given that it is not easily palpable on exam.   No follow-ups on file.  Jerilee FieldMatthew Jaidin Ugarte, MD  [facility] [address], [address] [place], [place] [address] [phone]

## 2019-07-03 NOTE — Telephone Encounter (Signed)
Discussed the [facility] Information form below over the phone with patient.    [place], [place], [place] [address] Telephone: [phone] Fax: [phone]   Thank you for choosing [facility] for your upcoming surgery!  We are always here to assist in your urological needs.  Please read the following information with specific details for your upcoming appointments related to your surgery. Please contact [facility] at [phone] Option 3 with any questions.  The Name of Your Surgery: Right hydrocelectomy Your Surgery Date: 07/17/2019 Your Surgeon: John Giovanni  Please call Same Day Surgery at [phone] between the hours of 1pm-3pm one day prior to your surgery. They will inform you of the time to arrive at Same Day Surgery which is located on the second floor of the [facility].   Please refer to the attached letter regarding instructions for Pre-Admission Testing. You will receive a call from the [place] office regarding your appointment with them.  The Pre-Admission Testing office is located at [place], on the first floor of the [facility] in [place] (office is to the right as you enter through the [employer] of the [facility]). Please have all medications you are currently taking and your insurance card available.  A COVID-19 test will be required prior to surgery and once test is performed you will need to remain in quarantine until the day of surgery. Patient was advised to have nothing to eat or drink after midnight the night prior to surgery except that he may have only water until 2 hours before surgery with nothing to drink within 2 hours of surgery.  The patient states he currently takes no blood thinners. Patient's questions were answered and he expressed understanding of these  instructions.

## 2019-07-04 ENCOUNTER — Ambulatory Visit: Payer: 59

## 2019-07-08 ENCOUNTER — Ambulatory Visit: Payer: 59

## 2019-07-12 ENCOUNTER — Other Ambulatory Visit: Admission: RE | Admit: 2019-07-12 | Payer: 59 | Source: Ambulatory Visit

## 2019-07-15 ENCOUNTER — Other Ambulatory Visit: Admission: RE | Admit: 2019-07-15 | Payer: 59 | Source: Ambulatory Visit

## 2019-07-15 ENCOUNTER — Inpatient Hospital Stay: Admission: RE | Admit: 2019-07-15 | Payer: 59 | Source: Ambulatory Visit

## 2019-07-17 ENCOUNTER — Encounter: Admission: RE | Payer: Self-pay | Source: Home / Self Care

## 2019-07-17 ENCOUNTER — Ambulatory Visit: Admission: RE | Admit: 2019-07-17 | Payer: 59 | Source: Home / Self Care | Admitting: Urology

## 2019-07-17 ENCOUNTER — Ambulatory Visit: Payer: BLUE CROSS/BLUE SHIELD | Admitting: Urology

## 2019-07-17 SURGERY — HYDROCELECTOMY
Anesthesia: Choice | Laterality: Right

## 2019-07-25 ENCOUNTER — Ambulatory Visit: Admission: RE | Admit: 2019-07-25 | Payer: 59 | Source: Ambulatory Visit

## 2019-07-29 NOTE — Progress Notes (Deleted)
07/30/2019 9:28 PM   Riad Malen Gauze 30-Apr-1971 [id number]  Referring provider: Glean Hess, [place],  [place] [address]  No chief complaint on file.   HPI:  Laroy returns with a right hydrocele.  Hydrocele noted in about 2008 on the left side.  Ultrasound July 18, 2017 confirmed a left hydrocele.  He ultimately underwent aspiration in the office on 08/01/2017 yielding about 450 cc of straw-colored fluid.  He returns and has recurrence of bothersome right hydrocele. It remained small for only about "a month" in 2018 and he is no ready to entertain surgical repair. The hydrocele seems larger and more bothersome. It gets in the way of daily activities. No voiding complaints. No gross hematuria or dysuria.   PMH: Past Medical History:  Diagnosis Date  . Hydrocele     Surgical History: Past Surgical History:  Procedure Laterality Date  . WISDOM TOOTH EXTRACTION      Home Medications:  Allergies as of 07/30/2019   No Known Allergies     Medication List       Accurate as of July 29, 2019  9:28 PM. If you have any questions, ask your nurse or doctor.        multivitamin with minerals Tabs tablet Take 1 tablet by mouth 2 (two) times a week. One-A-Day       Allergies: No Known Allergies  Family History: Family History  Problem Relation Age of Onset  . Diabetes Mother   . Stomach cancer Father        intestinal   . Heart failure Paternal Grandmother     Social History:  reports that he has never smoked. He has never used smokeless tobacco. He reports that he does not drink alcohol or use drugs.  ROS:                                        Physical Exam: There were no vitals taken for this visit.  Constitutional:  Alert and oriented, No acute distress. HEENT: [place] AT, moist mucus membranes.  Trachea midline, no masses. Cardiovascular: No clubbing, cyanosis, or edema. Respiratory: Normal respiratory  effort, no increased work of breathing. GI: Abdomen is soft, nontender, nondistended, no abdominal masses GU: No CVA tenderness. Penis circumcised, normal foreskin, testicles descended bilaterally. Left palpably normal, left epididymis palpably normal, scrotum normal; large tense right hydrocele ~ 10 cm  DRE: Prostate 30 g, smooth without hard area or nodule Lymph: No cervical or inguinal lymphadenopathy. Skin: No rashes, bruises or suspicious lesions. Neurologic: Grossly intact, no focal deficits, moving all 4 extremities. Psychiatric: Normal mood and affect.  Laboratory Data: Lab Results  Component Value Date   WBC 6.2 12/05/2017   HGB 13.4 12/05/2017   HCT 41.9 12/05/2017   MCV 86 12/05/2017   PLT 253 12/05/2017    Lab Results  Component Value Date   CREATININE 0.99 12/05/2017    No results found for: PSA  No results found for: TESTOSTERONE  No results found for: HGBA1C  Urinalysis    Component Value Date/Time   APPEARANCEUR Clear 07/04/2017 1307   GLUCOSEU Negative 07/04/2017 1307   BILIRUBINUR neg 12/05/2017 0848   BILIRUBINUR Negative 07/04/2017 1307   PROTEINUR trace 12/05/2017 0848   PROTEINUR Negative 07/04/2017 1307   UROBILINOGEN 0.2 12/05/2017 0848   NITRITE neg 12/05/2017 0848   NITRITE Negative 07/04/2017 1307  LEUKOCYTESUR Negative 12/05/2017 0848   LEUKOCYTESUR Negative 07/04/2017 1307    Lab Results  Component Value Date   LABMICR See below: 07/04/2017   WBCUA 0-5 07/04/2017   RBCUA None seen 07/04/2017   LABEPIT None seen 07/04/2017   BACTERIA None seen 07/04/2017    Pertinent Imaging:  Assessment & Plan:    Right hydrocele - we discussed the nature r/b/a to right hydrocelectomy and he elects to proceed. Discussed one of my colleagues will be doing the procedure. I would recommend a repeat US to assess the right testicle given that it is not easily palpable on exam.   No follow-ups on file.  Michiel CowboySHANNON Erastus Bartolomei, PA-C  [facility] [address], [address] [place], [place] [address] [phone]

## 2019-07-30 ENCOUNTER — Ambulatory Visit: Payer: BLUE CROSS/BLUE SHIELD | Admitting: Urology

## 2019-07-31 ENCOUNTER — Encounter: Payer: Self-pay | Admitting: Urology
